# Patient Record
Sex: Male | Born: 1960 | Race: White | Hispanic: No | Marital: Married | Smoking: Never smoker
Health system: Southern US, Community
[De-identification: ages and names within clinical notes are randomized; demographics above are authoritative.]

## PROBLEM LIST (undated history)

## (undated) DIAGNOSIS — I10 Essential (primary) hypertension: Secondary | ICD-10-CM

## (undated) HISTORY — PX: CHOLECYSTECTOMY: SHX55

## (undated) HISTORY — PX: TONSILLECTOMY: SUR1361

---

## 2009-10-09 ENCOUNTER — Observation Stay: Payer: Self-pay | Admitting: Surgery

## 2009-10-11 LAB — PATHOLOGY REPORT

## 2011-07-22 ENCOUNTER — Emergency Department: Payer: Self-pay | Admitting: Unknown Physician Specialty

## 2011-07-22 LAB — COMPREHENSIVE METABOLIC PANEL
Albumin: 4.3 g/dL (ref 3.4–5.0)
Alkaline Phosphatase: 71 U/L (ref 50–136)
Anion Gap: 10 (ref 7–16)
BUN: 26 mg/dL — ABNORMAL HIGH (ref 7–18)
Bilirubin,Total: 0.9 mg/dL (ref 0.2–1.0)
Calcium, Total: 9.5 mg/dL (ref 8.5–10.1)
Chloride: 104 mmol/L (ref 98–107)
Co2: 24 mmol/L (ref 21–32)
Creatinine: 0.96 mg/dL (ref 0.60–1.30)
EGFR (African American): >60
Glucose: 101 mg/dL — ABNORMAL HIGH (ref 65–99)
Osmolality: 281 (ref 275–301)
Total Protein: 7.9 g/dL (ref 6.4–8.2)

## 2011-07-22 LAB — TROPONIN I: Troponin-I: <0.02 ng/mL

## 2011-07-22 LAB — CK TOTAL AND CKMB (NOT AT ARMC)
CK, Total: 110 U/L (ref 35–232)
CK-MB: 1.3 ng/mL (ref 0.5–3.6)

## 2011-07-22 LAB — CBC
HGB: 18.1 g/dL — ABNORMAL HIGH (ref 13.0–18.0)
Platelet: 191 10*3/uL (ref 150–440)
WBC: 8.9 10*3/uL (ref 3.8–10.6)

## 2011-07-22 LAB — MAGNESIUM: Magnesium: 2.1 mg/dL

## 2011-08-13 IMAGING — CR DG CHEST 1V PORT
1 series · 1 of 1 positions shown · non-contrast
Comparison: none

REASON FOR EXAM: Chest Pain
COMMENTS:

PROCEDURE:     DXR - DXR PORTABLE CHEST SINGLE VIEW  - October 09, 2009  [DATE]
RESULT:     The lung fields are clear. No pneumonia, pneumothorax or pleural
effusion is seen. The heart appears mildly enlarged. This may in part be
secondary to the AP technique of the current exam.

[view not recorded]
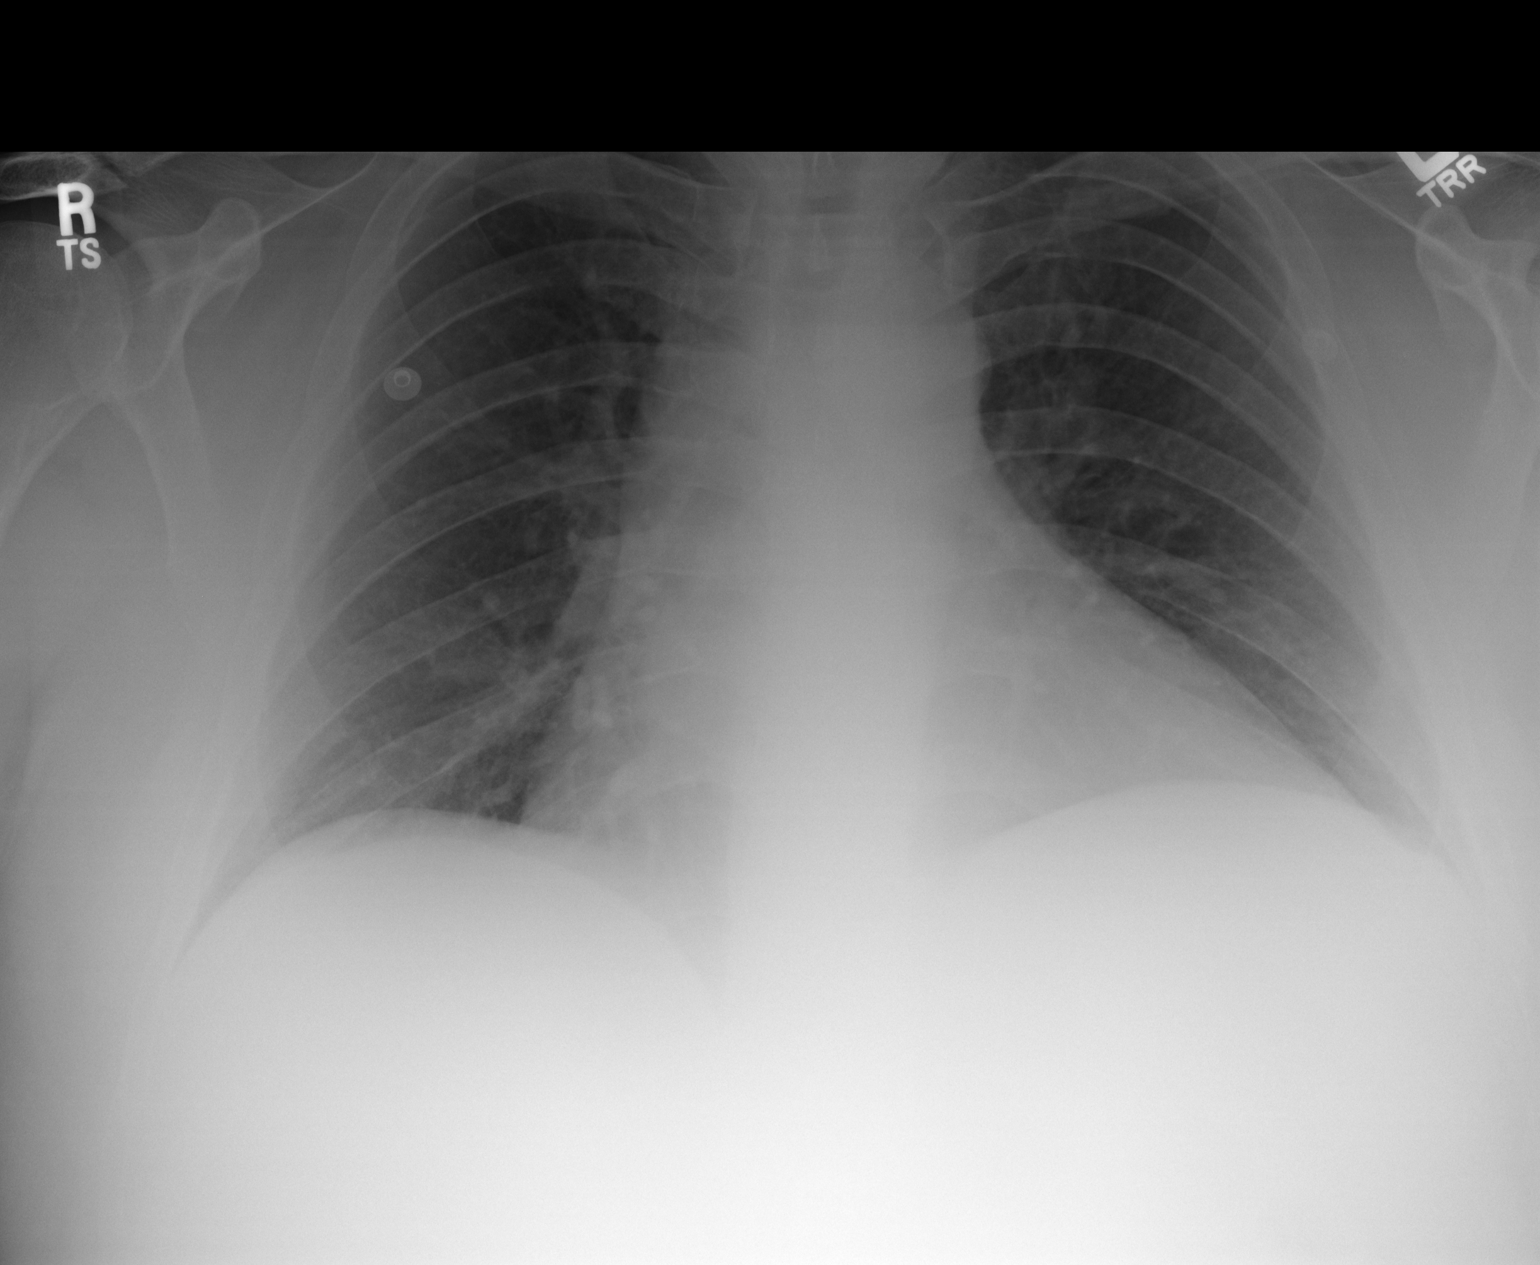

[1 of 1 positions shown; findings below may reference images not displayed]

IMPRESSION: 1. No acute changes are identified.
2. Possible mild cardiac enlargement.

## 2011-08-13 IMAGING — CR DG ABDOMEN 1V
1 series · 2 of 2 positions shown · non-contrast
Comparison: none

REASON FOR EXAM: possible retained drain in RUQ after lap chole
COMMENTS:   Bedside (portable):Y

[Series 1: view not recorded · 0.17mm/px · 2 of 2 slices shown]
[im 1/2]
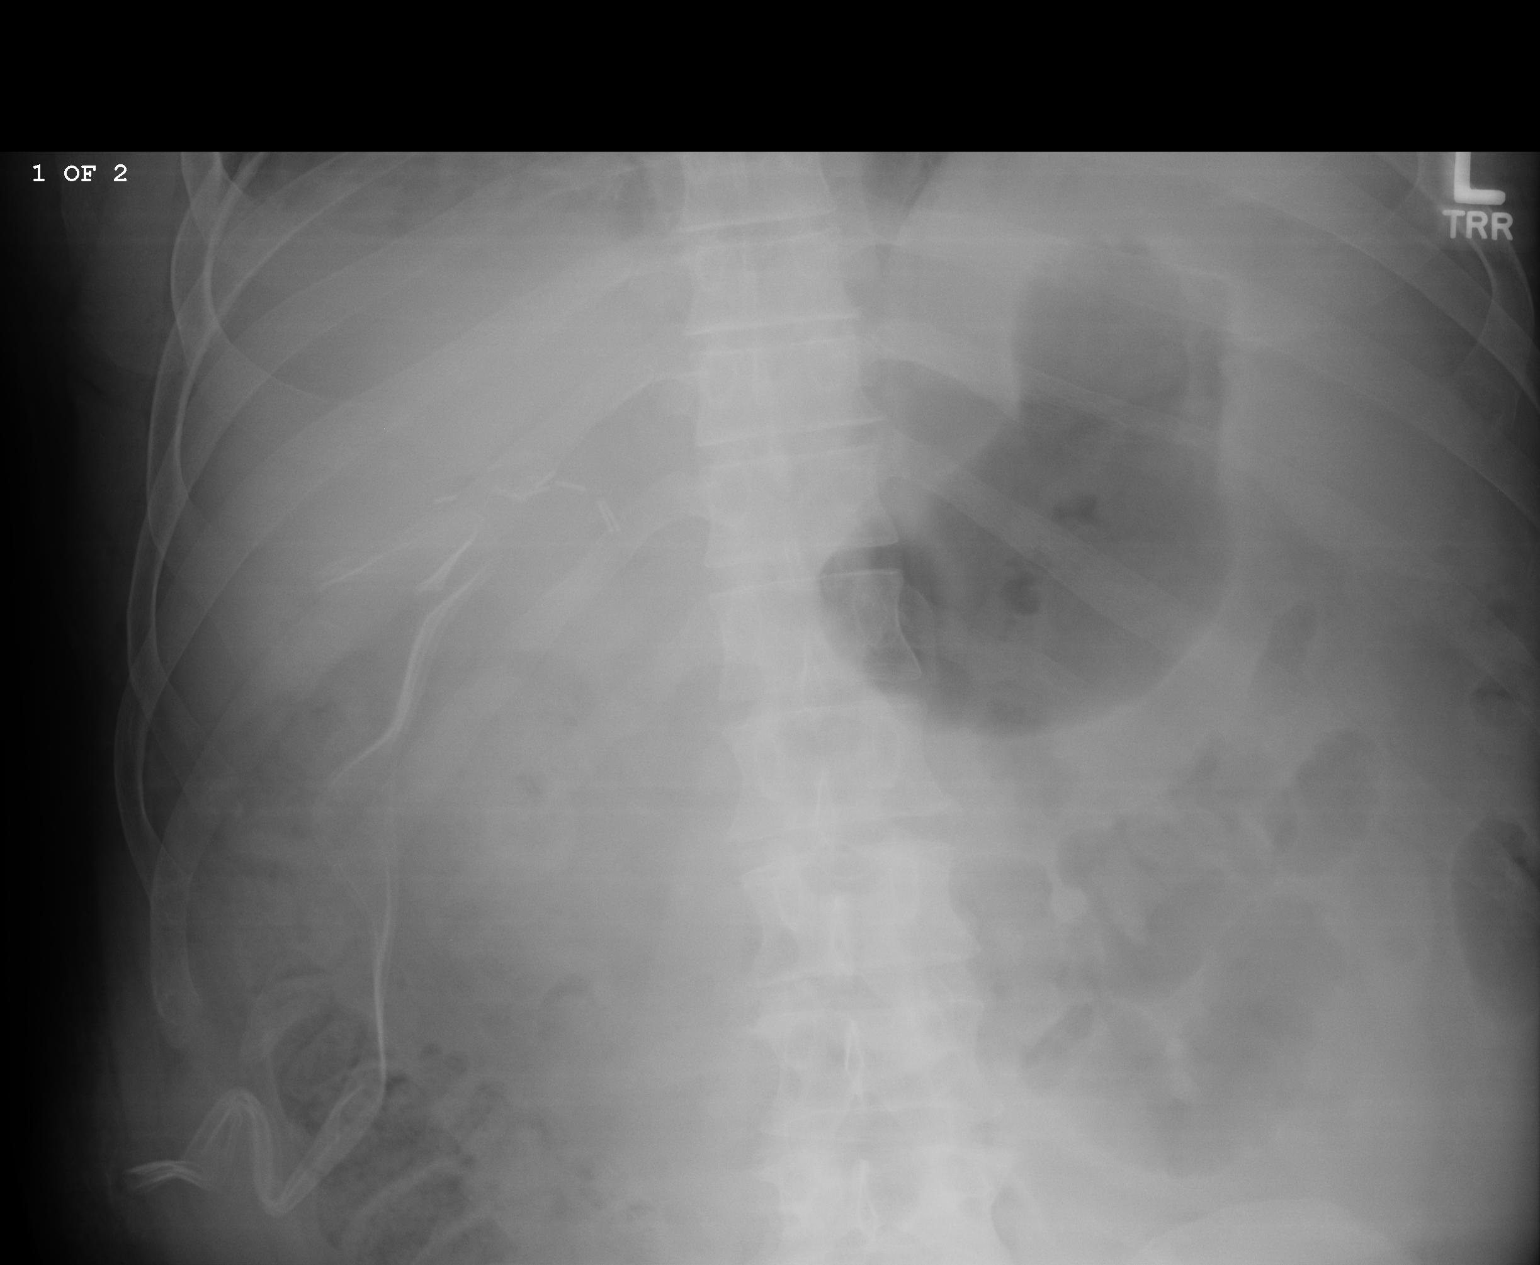
[im 2/2]
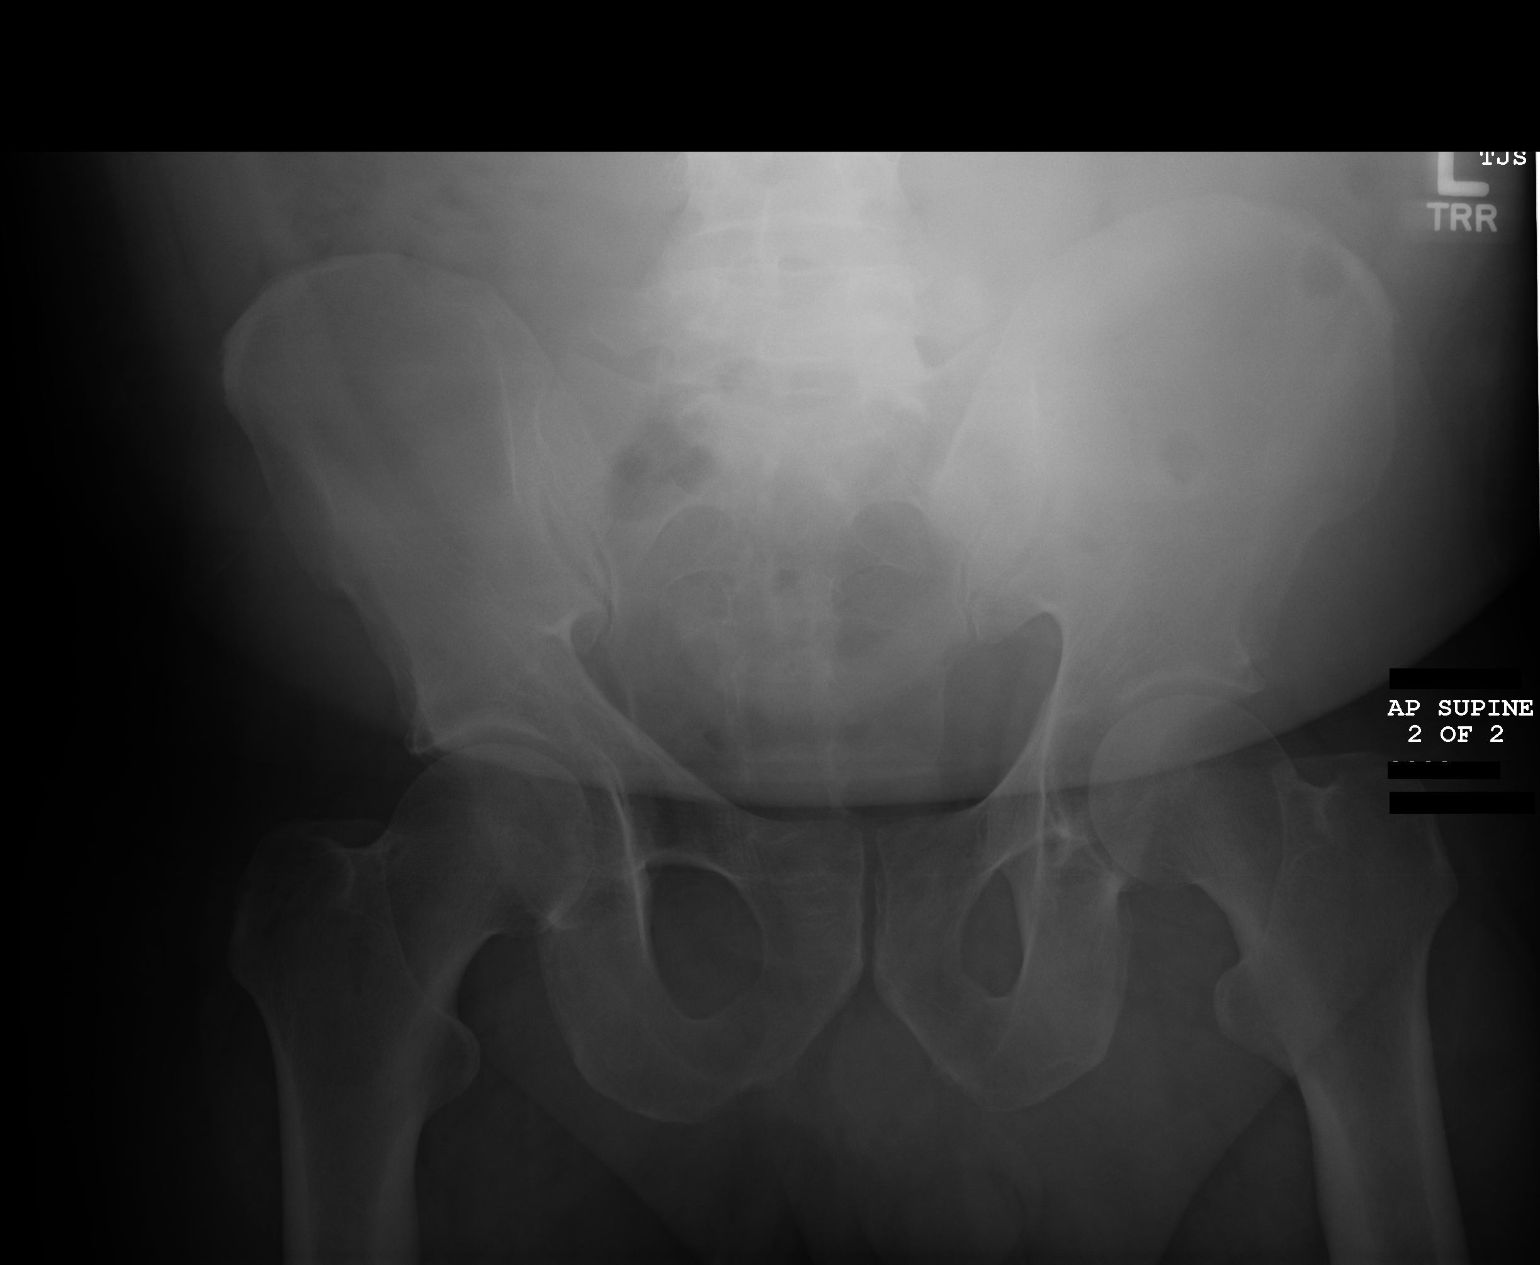

[2 of 2 positions shown; findings below may reference images not displayed]

PROCEDURE:     DXR - DXR ABDOMEN AP ONLY  - October 09, 2009  [DATE]

RESULT:     Evaluation of the abdomen demonstrates a serpiginous shaped area
of increased density projecting from the right mid abdomen to the lateral
right lower quadrant. This has the appearance of a surgical drain. Skin
staples are appreciated within the right upper quadrant. Air is seen within
nondilated loops of large and small bowel. There is a paucity of bowel gas.
The visualized bony skeleton is unremarkable.
IMPRESSION: 1. Findings likely representing a surgical drain within the right mid
abdomen extending to the lower abdomen. There are findings consistent with
surgical clips in the gallbladder fossa likely representing a recent
cholecystectomy.

2. Nonobstructive bowel gas pattern with a paucity of bowel gas.

## 2014-07-31 ENCOUNTER — Emergency Department
Admission: EM | Admit: 2014-07-31 | Discharge: 2014-07-31 | Disposition: A | Discharge status: Home / Self Care | Payer: BLUE CROSS/BLUE SHIELD | Attending: Emergency Medicine | Admitting: Emergency Medicine

## 2014-07-31 ENCOUNTER — Emergency Department: Payer: BLUE CROSS/BLUE SHIELD

## 2014-07-31 ENCOUNTER — Encounter: Payer: Self-pay | Admitting: Emergency Medicine

## 2014-07-31 DIAGNOSIS — I10 Essential (primary) hypertension: Secondary | ICD-10-CM | POA: Insufficient documentation

## 2014-07-31 DIAGNOSIS — M7051 Other bursitis of knee, right knee: Secondary | ICD-10-CM | POA: Diagnosis not present

## 2014-07-31 DIAGNOSIS — Y9389 Activity, other specified: Secondary | ICD-10-CM | POA: Insufficient documentation

## 2014-07-31 DIAGNOSIS — M1712 Unilateral primary osteoarthritis, left knee: Secondary | ICD-10-CM | POA: Insufficient documentation

## 2014-07-31 DIAGNOSIS — M25561 Pain in right knee: Secondary | ICD-10-CM | POA: Diagnosis present

## 2014-07-31 HISTORY — DX: Essential (primary) hypertension: I10

## 2014-07-31 MED ORDER — DEXAMETHASONE SODIUM PHOSPHATE 10 MG/ML IJ SOLN
INTRAMUSCULAR | Status: AC
  Filled 2014-07-31: qty 1

## 2014-07-31 MED ORDER — OXYCODONE-ACETAMINOPHEN 5-325 MG PO TABS
ORAL_TABLET | ORAL | Status: AC
  Filled 2014-07-31: qty 1

## 2014-07-31 MED ORDER — KETOROLAC TROMETHAMINE 60 MG/2ML IM SOLN
INTRAMUSCULAR | Status: AC
  Filled 2014-07-31: qty 2

## 2014-07-31 MED ORDER — KETOROLAC TROMETHAMINE 60 MG/2ML IM SOLN
60.0000 mg | Freq: Once | INTRAMUSCULAR | Status: AC
  Administered 2014-07-31: 60 mg via INTRAMUSCULAR

## 2014-07-31 MED ORDER — DEXAMETHASONE SODIUM PHOSPHATE 10 MG/ML IJ SOLN
10.0000 mg | Freq: Once | INTRAMUSCULAR | Status: AC
  Administered 2014-07-31: 10 mg via INTRAMUSCULAR

## 2014-07-31 MED ORDER — KETOROLAC TROMETHAMINE 10 MG PO TABS
ORAL_TABLET | ORAL | Status: AC
  Filled 2014-07-31: qty 1

## 2014-07-31 MED ORDER — OXYCODONE-ACETAMINOPHEN 5-325 MG PO TABS: 1.0000 | ORAL_TABLET | Freq: Four times a day (QID) | ORAL | Status: AC | PRN

## 2014-07-31 MED ORDER — OXYCODONE-ACETAMINOPHEN 5-325 MG PO TABS
1.0000 | ORAL_TABLET | Freq: Once | ORAL | Status: AC
  Administered 2014-07-31: 1 via ORAL

## 2014-07-31 MED ORDER — KETOROLAC TROMETHAMINE 10 MG PO TABS: 10.0000 mg | ORAL_TABLET | Freq: Four times a day (QID) | ORAL | Status: AC | PRN

## 2014-07-31 NOTE — ED Notes (Signed)
Pt reports left knee pain for a week; denies injury; wife says they are getting ready to go on vacation and pt needs it looked at before they go

## 2014-07-31 NOTE — ED Notes (Signed)
Pt presents to ER alert and in NAD. pt states left knee pain for several days, denies injury.

## 2014-07-31 NOTE — Discharge Instructions (Signed)
Bursitis Bursitis is when the fluid-filled sac (bursa) that covers and protects a joint gets puffy and irritated. The elbow, shoulder, hip, and knee joints are most often affected. HOME CARE  Put ice on the area.  Put ice in a plastic bag.  Place a towel between your skin and the bag.  Leave the ice on for 15-20 minutes, 03-04 times a day.  Put the joint through a full range of motion 4 times a day. Rest the injured joint at other times. When you have less pain, begin slow movements and usual activities.  Only take medicine as told by your doctor.  Follow up with your doctor. Any delay in care could stop the bursitis from healing. This could cause long-term pain. GET HELP RIGHT AWAY IF:   You have more pain with treatment.  You have a temperature by mouth above 102 F (38.9 C), not controlled by medicine.  You have heat and irritation over the fluid-filled sac. MAKE SURE YOU:   Understand these instructions.  Will watch your condition.  Will get help right away if you are not doing well or get worse. Document Released: 07/18/2009 Document Revised: 04/22/2011 Document Reviewed: 04/19/2013 [facility] Patient Information 2015 [place], [place]. This information is not intended to replace advice given to you by your health care provider. Make sure you discuss any questions you have with your health care provider.  Arthritis, Nonspecific Arthritis is inflammation of a joint. This usually means pain, redness, warmth or swelling are present. One or more joints may be involved. There are a number of types of arthritis. Your caregiver may not be able to tell what type of arthritis you have right away. CAUSES  The most common cause of arthritis is the wear and tear on the joint (osteoarthritis). This causes damage to the cartilage, which can break down over time. The knees, hips, back and neck are most often affected by this type of arthritis. Other types of arthritis and common causes of  joint pain include:  Sprains and other injuries near the joint. Sometimes minor sprains and injuries cause pain and swelling that develop hours later.  Rheumatoid arthritis. This affects hands, feet and knees. It usually affects both sides of your body at the same time. It is often associated with chronic ailments, fever, weight loss and general weakness.  Crystal arthritis. Gout and pseudo gout can cause occasional acute severe pain, redness and swelling in the foot, ankle, or knee.  Infectious arthritis. Bacteria can get into a joint through a break in overlying skin. This can cause infection of the joint. Bacteria and viruses can also spread through the blood and affect your joints.  Drug, infectious and allergy reactions. Sometimes joints can become mildly painful and slightly swollen with these types of illnesses. SYMPTOMS   Pain is the main symptom.  Your joint or joints can also be red, swollen and warm or hot to the touch.  You may have a fever with certain types of arthritis, or even feel overall ill.  The joint with arthritis will hurt with movement. Stiffness is present with some types of arthritis. DIAGNOSIS  Your caregiver will suspect arthritis based on your description of your symptoms and on your exam. Testing may be needed to find the type of arthritis:  Blood and sometimes urine tests.  X-ray tests and sometimes CT or MRI scans.  Removal of fluid from the joint (arthrocentesis) is done to check for bacteria, crystals or other causes. Your caregiver (or a  specialist) will numb the area over the joint with a local anesthetic, and use a needle to remove joint fluid for examination. This procedure is only minimally uncomfortable.  Even with these tests, your caregiver may not be able to tell what kind of arthritis you have. Consultation with a specialist (rheumatologist) may be helpful. TREATMENT  Your caregiver will discuss with you treatment specific to your type of  arthritis. If the specific type cannot be determined, then the following general recommendations may apply. Treatment of severe joint pain includes:  Rest.  Elevation.  Anti-inflammatory medication (for example, ibuprofen) may be prescribed. Avoiding activities that cause increased pain.  Only take over-the-counter or prescription medicines for pain and discomfort as recommended by your caregiver.  Cold packs over an inflamed joint may be used for 10 to 15 minutes every hour. Hot packs sometimes feel better, but do not use overnight. Do not use hot packs if you are diabetic without your caregiver's permission.  A cortisone shot into arthritic joints may help reduce pain and swelling.  Any acute arthritis that gets worse over the next 1 to 2 days needs to be looked at to be sure there is no joint infection. Long-term arthritis treatment involves modifying activities and lifestyle to reduce joint stress jarring. This can include weight loss. Also, exercise is needed to nourish the joint cartilage and remove waste. This helps keep the muscles around the joint strong. HOME CARE INSTRUCTIONS   Do not take aspirin to relieve pain if gout is suspected. This elevates uric acid levels.  Only take over-the-counter or prescription medicines for pain, discomfort or fever as directed by your caregiver.  Rest the joint as much as possible.  If your joint is swollen, keep it elevated.  Use crutches if the painful joint is in your leg.  Drinking plenty of fluids may help for certain types of arthritis.  Follow your caregiver's dietary instructions.  Try low-impact exercise such as:  Swimming.  Water aerobics.  Biking.  Walking.  Morning stiffness is often relieved by a warm shower.  Put your joints through regular range-of-motion. SEEK MEDICAL CARE IF:   You do not feel better in 24 hours or are getting worse.  You have side effects to medications, or are not getting better with  treatment. SEEK IMMEDIATE MEDICAL CARE IF:   You have a fever.  You develop severe joint pain, swelling or redness.  Many joints are involved and become painful and swollen.  There is severe back pain and/or leg weakness.  You have loss of bowel or bladder control. Document Released: 03/07/2004 Document Revised: 04/22/2011 Document Reviewed: 03/23/2008 [facility] Patient Information 2015 [place], [place]. This information is not intended to replace advice given to you by your health care provider. Make sure you discuss any questions you have with your health care provider.

## 2014-07-31 NOTE — ED Provider Notes (Signed)
[facility] Emergency Department Provider Note  ____________________________________________  Time seen: 2055  I have reviewed the triage vital signs and the nursing notes.   HISTORY  Chief Complaint Knee Pain    HPI Bryan Brady is a 54 y.o. male 1 week's worth of left knee pain denies trauma states that approximately a week ago twisted funny and then within a day started feeling like it was inflamed inside the joint rates pain as about a8 out of 10 at its worst nothing making up typically better or worse except staying off of it no other complaints this time denies numbness tingling weakness swelling in the calf states that he did have a old sports injury in that knee   Past Medical History  Diagnosis Date  . Hypertension     There are no active problems to display for this patient.   Past Surgical History  Procedure Laterality Date  . Cholecystectomy    . Tonsillectomy      Current Outpatient Rx  Name  Route  Sig  Dispense  Refill  . ketorolac (TORADOL) 10 MG tablet   Oral   Take 1 tablet (10 mg total) by mouth every 6 (six) hours as needed.   20 tablet   0   . oxyCODONE-acetaminophen (ROXICET) 5-325 MG per tablet   Oral   Take 1 tablet by mouth every 6 (six) hours as needed for moderate pain or severe pain.   12 tablet   0     Allergies Review of patient's allergies indicates no known allergies.  History reviewed. No pertinent family history.  Social History History  Substance Use Topics  . Smoking status: Never Smoker   . Smokeless tobacco: Not on file  . Alcohol Use: Yes    Review of Systems Constitutional: No fever/chills Eyes: No visual changes. ENT: No sore throat. Cardiovascular: Denies chest pain. Respiratory: Denies shortness of breath. Gastrointestinal: No abdominal pain.  No nausea, no vomiting.  No diarrhea.  No constipation. Genitourinary: Negative for dysuria. Musculoskeletal: Negative for back pain. Skin:  Negative for rash. Neurological: Negative for headaches, focal weakness or numbness.  10-point ROS otherwise negative.  ____________________________________________   PHYSICAL EXAM:  VITAL SIGNS: ED Triage Vitals  Enc Vitals Group     BP 07/31/14 1954 145/79 mmHg     Pulse Rate 07/31/14 1954 85     Resp 07/31/14 1954 20     Temp 07/31/14 1954 98.1 F (36.7 C)     Temp Source 07/31/14 1954 Oral     SpO2 07/31/14 1954 96 %     Weight 07/31/14 1954 279 lb (126.554 kg)     Height 07/31/14 1954  (1.854 m)     Head Cir --      Peak Flow --      Pain Score --      Pain Loc --      Pain Edu? --      Excl. in GC? --     Constitutional: Alert and oriented. Well appearing and in no acute distress. Eyes: Conjunctivae are normal. PERRL. EOMI. Head: Atraumatic. Nose: No congestion/rhinnorhea. Mouth/Throat: Mucous membranes are moist.  Oropharynx non-erythematous. Neck: No stridor.   Cardiovascular: Normal rate, regular rhythm. Grossly normal heart sounds.  Good peripheral circulation. Respiratory: Normal respiratory effort.  No retractions. Lungs CTAB. Marland Kitchen Musculoskeletal: No lower extremity tenderness nor edema.  No joint effusions. Mild crepitus and grinding in the left knee Neurologic:  Normal speech and language. No gross  focal neurologic deficits are appreciated. Speech is normal. No gait instability. Skin:  Skin is warm, dry and intact. No rash noted. Psychiatric: Mood and affect are normal. Speech and behavior are normal.  ____________________________________________   RADIOLOGY  FINDINGS: No acute bony or joint abnormality is identified. Joint spaces are preserved. Chondrocalcinosis is noted. There is no joint effusion.  IMPRESSION: Chondrocalcinosis. Otherwise negative.   Electronically Signed By: Drusilla Kanner M.D. On: 07/31/2014 20:29       ____________________________________________   PROCEDURES  Procedure(s) performed: Ace wrap was  applied to the patient's left knee  Critical Care performed: No  ____________________________________________   INITIAL IMPRESSION / ASSESSMENT AND PLAN / ED COURSE  Pertinent labs & imaging results that were available during my care of the patient were reviewed by me and considered in my medical decision making (see chart for details).  Initial impression arthritis and bursitis of his left knee recommend Rice anti-inflammatory disease given Toradol Decadron and Percocet here follow-up with orthopedics for further evaluation return here for acute concerns or worsening symptoms ____________________________________________   FINAL CLINICAL IMPRESSION(S) / ED DIAGNOSES  Final diagnoses:  Arthritis of knee, left  Bursitis of knee, right     Kinze Labo Rosalyn Gess, PA-C 07/31/14 2158  Sharyn Creamer, MD 07/31/14 2359

## 2016-11-01 DIAGNOSIS — Z7689 Persons encountering health services in other specified circumstances: Secondary | ICD-10-CM | POA: Diagnosis not present

## 2016-11-01 DIAGNOSIS — I10 Essential (primary) hypertension: Secondary | ICD-10-CM | POA: Diagnosis not present

## 2016-11-27 DIAGNOSIS — H5203 Hypermetropia, bilateral: Secondary | ICD-10-CM | POA: Diagnosis not present

## 2017-04-25 DIAGNOSIS — R972 Elevated prostate specific antigen [PSA]: Secondary | ICD-10-CM | POA: Diagnosis not present

## 2017-04-25 DIAGNOSIS — I10 Essential (primary) hypertension: Secondary | ICD-10-CM | POA: Diagnosis not present

## 2017-04-25 DIAGNOSIS — Z7689 Persons encountering health services in other specified circumstances: Secondary | ICD-10-CM | POA: Diagnosis not present

## 2017-05-02 DIAGNOSIS — M1711 Unilateral primary osteoarthritis, right knee: Secondary | ICD-10-CM | POA: Diagnosis not present

## 2017-05-02 DIAGNOSIS — R635 Abnormal weight gain: Secondary | ICD-10-CM | POA: Diagnosis not present

## 2017-05-02 DIAGNOSIS — R739 Hyperglycemia, unspecified: Secondary | ICD-10-CM | POA: Diagnosis not present

## 2017-05-02 DIAGNOSIS — I10 Essential (primary) hypertension: Secondary | ICD-10-CM | POA: Diagnosis not present

## 2017-09-28 ENCOUNTER — Other Ambulatory Visit: Payer: Self-pay

## 2017-09-28 ENCOUNTER — Emergency Department
Admission: EM | Admit: 2017-09-28 | Discharge: 2017-09-28 | Disposition: A | Discharge status: Home / Self Care | Payer: 59 | Attending: Emergency Medicine | Admitting: Emergency Medicine

## 2017-09-28 ENCOUNTER — Emergency Department: Payer: 59

## 2017-09-28 DIAGNOSIS — M545 Low back pain, unspecified: Secondary | ICD-10-CM

## 2017-09-28 DIAGNOSIS — Y92814 Boat as the place of occurrence of the external cause: Secondary | ICD-10-CM | POA: Diagnosis not present

## 2017-09-28 DIAGNOSIS — Y998 Other external cause status: Secondary | ICD-10-CM | POA: Diagnosis not present

## 2017-09-28 DIAGNOSIS — I10 Essential (primary) hypertension: Secondary | ICD-10-CM | POA: Insufficient documentation

## 2017-09-28 DIAGNOSIS — R0789 Other chest pain: Secondary | ICD-10-CM | POA: Diagnosis not present

## 2017-09-28 DIAGNOSIS — M546 Pain in thoracic spine: Secondary | ICD-10-CM | POA: Diagnosis not present

## 2017-09-28 DIAGNOSIS — S39012A Strain of muscle, fascia and tendon of lower back, initial encounter: Secondary | ICD-10-CM | POA: Insufficient documentation

## 2017-09-28 DIAGNOSIS — X509XXA Other and unspecified overexertion or strenuous movements or postures, initial encounter: Secondary | ICD-10-CM | POA: Insufficient documentation

## 2017-09-28 DIAGNOSIS — S3992XA Unspecified injury of lower back, initial encounter: Secondary | ICD-10-CM | POA: Diagnosis present

## 2017-09-28 DIAGNOSIS — Y9389 Activity, other specified: Secondary | ICD-10-CM | POA: Diagnosis not present

## 2017-09-28 MED ORDER — METHOCARBAMOL 500 MG PO TABS: 500.0000 mg | ORAL_TABLET | Freq: Four times a day (QID) | ORAL | 0 refills | Status: AC

## 2017-09-28 MED ORDER — KETOROLAC TROMETHAMINE 30 MG/ML IJ SOLN
30.0000 mg | Freq: Once | INTRAMUSCULAR | Status: AC
  Administered 2017-09-28: 30 mg via INTRAMUSCULAR
  Filled 2017-09-28: qty 1

## 2017-09-28 MED ORDER — METHOCARBAMOL 500 MG PO TABS
1000.0000 mg | ORAL_TABLET | Freq: Once | ORAL | Status: AC
  Administered 2017-09-28: 1000 mg via ORAL
  Filled 2017-09-28: qty 2

## 2017-09-28 MED ORDER — HYDROCODONE-ACETAMINOPHEN 5-325 MG PO TABS: ORAL_TABLET | ORAL | 0 refills | Status: AC

## 2017-09-28 MED ORDER — HYDROCODONE-ACETAMINOPHEN 5-325 MG PO TABS
1.0000 | ORAL_TABLET | Freq: Once | ORAL | Status: AC
  Administered 2017-09-28: 1 via ORAL
  Filled 2017-09-28: qty 1

## 2017-09-28 MED ORDER — ETODOLAC 400 MG PO TABS: 400.0000 mg | ORAL_TABLET | Freq: Two times a day (BID) | ORAL | 0 refills | Status: AC

## 2017-09-28 NOTE — Discharge Instructions (Signed)
Up with your primary care provider if any continued problems.  Do not take pain medication or muscle relaxant while driving or working.  You may take the etodolac 400 mg twice daily with food.  This medication does not cause drowsiness.  You may also take plain Tylenol while at work if needed for pain.  Take muscle relaxant and pain medication at bedtime if needed.  May also use ice or heat to your back as needed for discomfort.
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
## 2017-11-04 DIAGNOSIS — E785 Hyperlipidemia, unspecified: Secondary | ICD-10-CM | POA: Diagnosis not present

## 2017-11-04 DIAGNOSIS — I10 Essential (primary) hypertension: Secondary | ICD-10-CM | POA: Diagnosis not present

## 2017-11-04 DIAGNOSIS — R739 Hyperglycemia, unspecified: Secondary | ICD-10-CM | POA: Diagnosis not present

## 2017-11-04 DIAGNOSIS — F439 Reaction to severe stress, unspecified: Secondary | ICD-10-CM | POA: Diagnosis not present

## 2017-12-31 DIAGNOSIS — H5203 Hypermetropia, bilateral: Secondary | ICD-10-CM | POA: Diagnosis not present

## 2017-12-31 DIAGNOSIS — H524 Presbyopia: Secondary | ICD-10-CM | POA: Diagnosis not present

## 2019-08-02 IMAGING — CR DG LUMBAR SPINE 2-3V
1 series · 3 of 3 positions shown · non-contrast
Comparison: None.

CLINICAL DATA: Back pain.

EXAM:
LUMBAR SPINE - 2-3 VIEW

[Series 1: dg lumbar spine 2-3 views · 0.14mm/px · 3 of 3 slices shown]
[im 1/3]
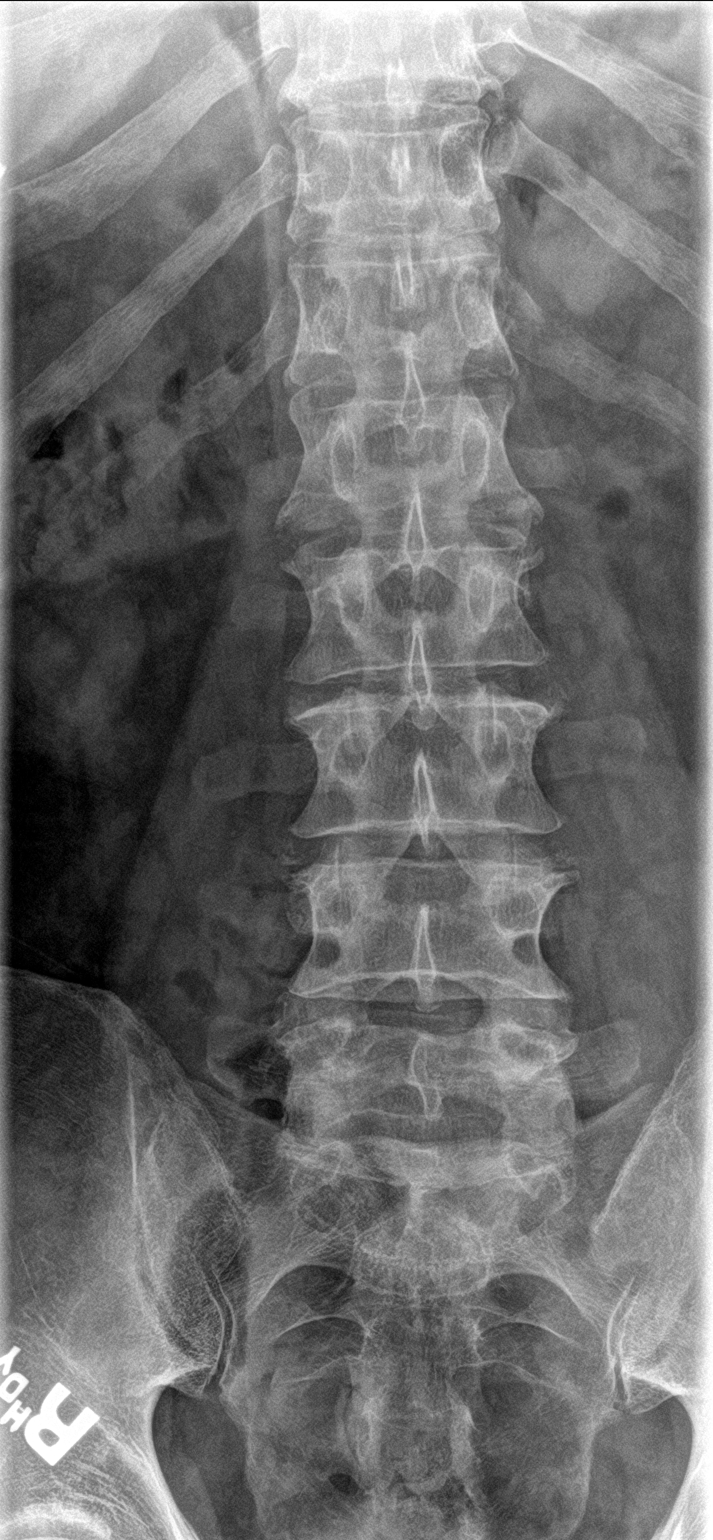
[im 2/3]
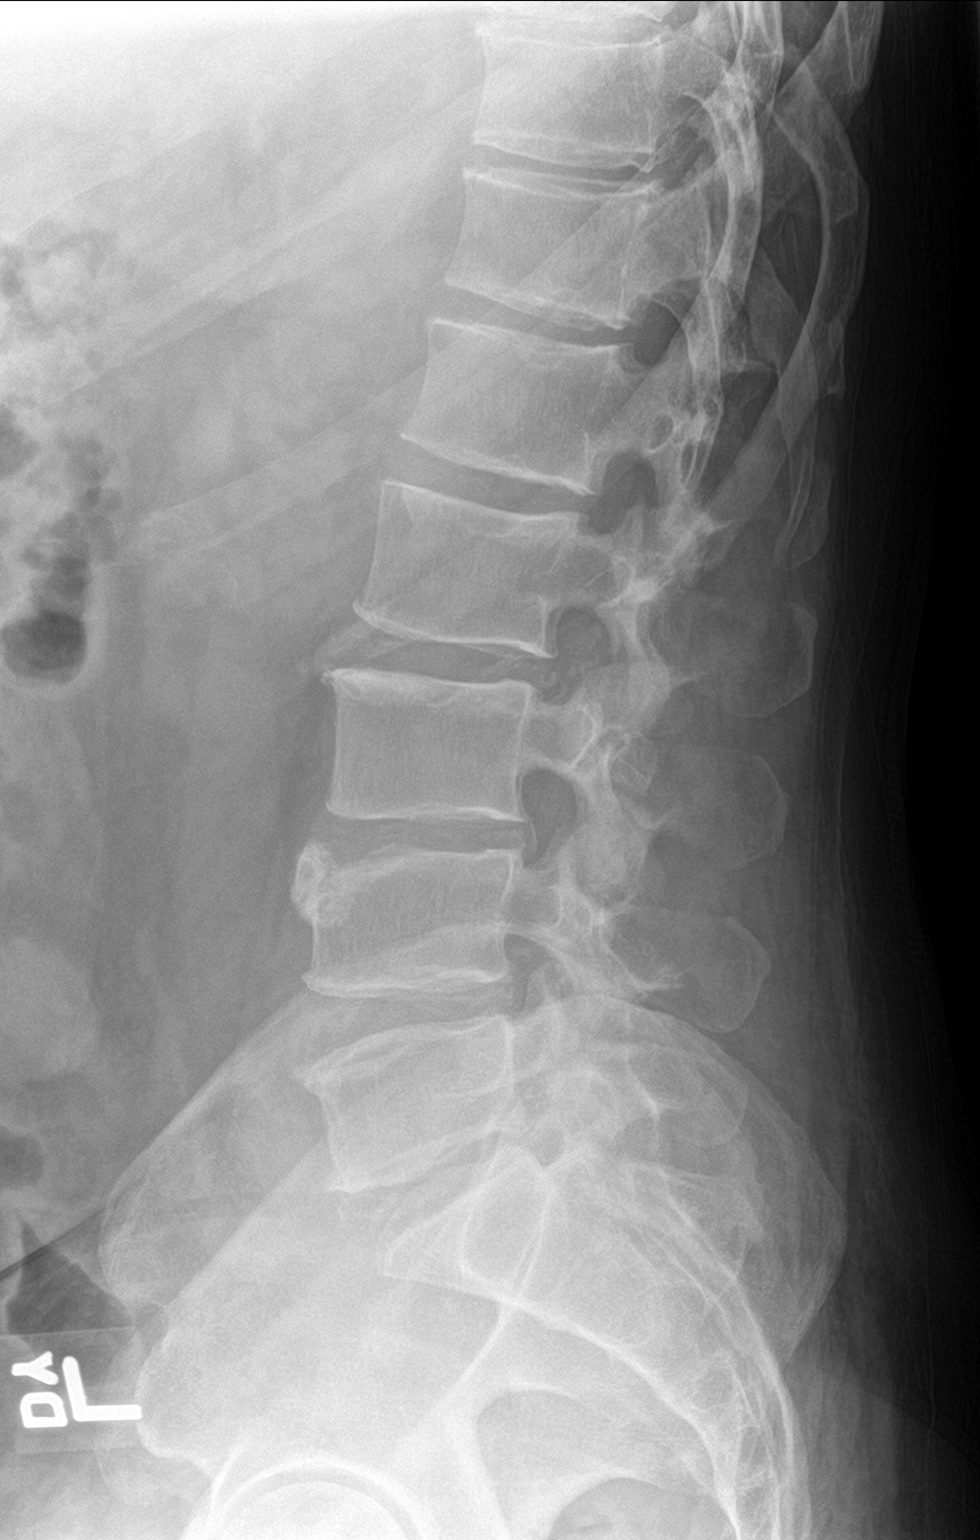
[im 3/3]
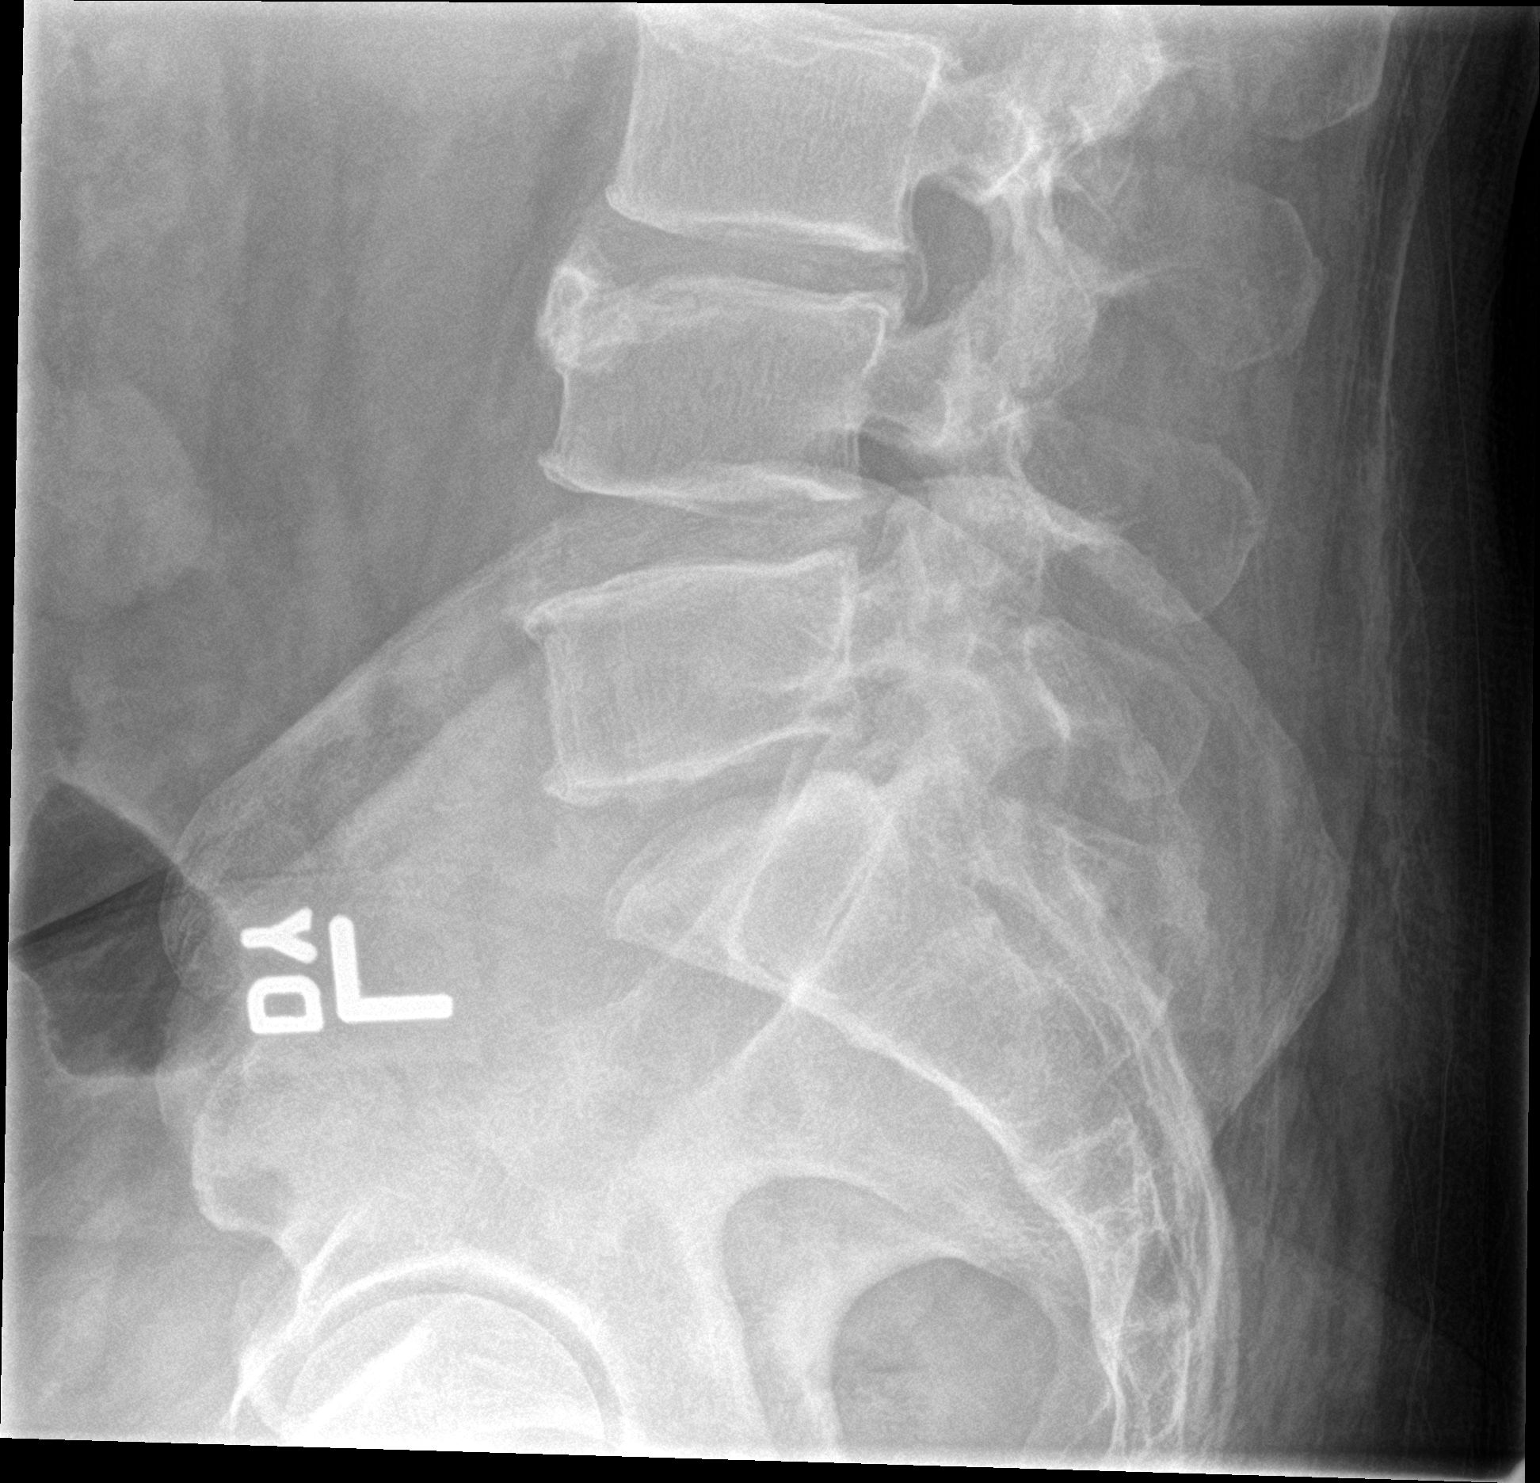

[3 of 3 positions shown; findings below may reference images not displayed]

FINDINGS: Five lumbar type vertebral bodies. L4 limbus vertebra. No acute
fracture or subluxation. Vertebral body heights are preserved.
Alignment is normal. Mild disc height loss at L3-L4. Remaining
intervertebral disc spaces are maintained. Lower lumbar facet
arthropathy. The sacroiliac joints are unremarkable.
IMPRESSION: 1.  No acute osseous abnormality.
2. Mild lumbar spondylosis.

## 2019-09-07 ENCOUNTER — Ambulatory Visit: Payer: Self-pay | Attending: Internal Medicine

## 2019-09-07 DIAGNOSIS — Z23 Encounter for immunization: Secondary | ICD-10-CM

## 2019-09-07 NOTE — Progress Notes (Signed)
   Covid-19 Vaccination Clinic  Name:  Bryan Brady    MRN: [id number] DOB: [birth year]  09/07/2019  Mr. [name] was observed post Covid-19 immunization for 15 minutes without incident. He was provided with Vaccine Information Sheet and instruction to access the V-Safe system.   Mr. [name] was instructed to call 911 with any severe reactions post vaccine: Marland Kitchen Difficulty breathing  . Swelling of face and throat  . A fast heartbeat  . A bad rash all over body  . Dizziness and weakness   Immunizations Administered    Name Date Dose VIS Date Route   JANSSEN COVID-19 VACCINE 09/07/2019 10:00 AM 0.5 mL 04/10/2019 Intramuscular   Manufacturer: [employer]   Lot: [id number]   NDC: 56387-564-33

## 2022-10-28 ENCOUNTER — Ambulatory Visit: Payer: Self-pay | Admitting: Internal Medicine

## 2023-01-16 DIAGNOSIS — H5213 Myopia, bilateral: Secondary | ICD-10-CM | POA: Diagnosis not present
